# Patient Record
Sex: Male | Born: 1995 | Race: White | Hispanic: No | Marital: Single | Smoking: Never smoker
Health system: Southern US, Community
[De-identification: ages and names within clinical notes are randomized; demographics above are authoritative.]

## PROBLEM LIST (undated history)

## (undated) DIAGNOSIS — N63 Unspecified lump in unspecified breast: Secondary | ICD-10-CM

## (undated) DIAGNOSIS — F419 Anxiety disorder, unspecified: Secondary | ICD-10-CM

## (undated) DIAGNOSIS — J302 Other seasonal allergic rhinitis: Secondary | ICD-10-CM

## (undated) DIAGNOSIS — R300 Dysuria: Secondary | ICD-10-CM

## (undated) DIAGNOSIS — F429 Obsessive-compulsive disorder, unspecified: Secondary | ICD-10-CM

## (undated) DIAGNOSIS — H6093 Unspecified otitis externa, bilateral: Secondary | ICD-10-CM

## (undated) DIAGNOSIS — K219 Gastro-esophageal reflux disease without esophagitis: Secondary | ICD-10-CM

## (undated) DIAGNOSIS — N62 Hypertrophy of breast: Secondary | ICD-10-CM

## (undated) HISTORY — DX: Dysuria: R30.0

## (undated) HISTORY — DX: Unspecified lump in unspecified breast: N63.0

## (undated) HISTORY — DX: Hypertrophy of breast: N62

---
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
## 2017-01-05 ENCOUNTER — Encounter: Payer: Self-pay | Admitting: General Surgery

## 2017-01-05 ENCOUNTER — Ambulatory Visit (INDEPENDENT_AMBULATORY_CARE_PROVIDER_SITE_OTHER): Payer: Medicaid Other | Admitting: General Surgery

## 2017-01-05 VITALS — BP 130/70 | HR 78 | Resp 14 | Ht 61.0 in | Wt 133.0 lb

## 2017-01-05 DIAGNOSIS — N6082 Other benign mammary dysplasias of left breast: Secondary | ICD-10-CM

## 2017-01-05 NOTE — Progress Notes (Signed)
Patient ID: Sean Compton, male   DOB: 1995-10-01, 21 y.o.   MRN: [id number]  Chief Complaint  Patient presents with  . Follow-up    HPI Sean Compton is a 21 y.o. male here today for a follow up left breast nodule. Patient states the area feels hard and has got bigger.  He noticed this about one week ago. He is here with his mother, Sean Compton.  The patient's mother reports because of his OCD he is unable to refrain from manipulating the area of concern as suggested his last visit.  She reported that she wanted an ultrasound completed to determine if this was a cyst.  There is a strong family history of breast cancer, and by the patient's guardians report, his biologic mother had breast cancer at age under 12, although she is unclear whether she had undergone any genetic testing. The best I understand the family dynamics 2 daughters from this woman R BRCA negative but have some mutation that was 7 high risk which results in increased frequency of screening.  No documentation regarding the patient's siblings/first-degree relatives are available.  HPI  Past Medical History:  Diagnosis Date  . Anxiety   . Bilateral external ear infections   . GERD (gastroesophageal reflux disease)   . Gynecomastia 2012   right   . OCD (obsessive compulsive disorder)   . Seasonal allergies     History reviewed. No pertinent surgical history.  Family History  Problem Relation Age of Onset  . Breast cancer Mother 26       states non genetic while in [place].  . Breast cancer Maternal Aunt 22       cystosarcoma   . Thyroid cancer Maternal Uncle 52  . Breast cancer Maternal Grandmother   . Prostate cancer Neg Hx   . Bladder Cancer Neg Hx   . Kidney cancer Neg Hx     Social History Social History   Tobacco Use  . Smoking status: Never Smoker  . Smokeless tobacco: Never Used  Substance Use Topics  . Alcohol use: No  . Drug use: No    Allergies  Allergen Reactions  . Shellfish Allergy Other  (See Comments)    Unknown  . Codeine Rash    Current Outpatient Medications  Medication Sig Dispense Refill  . diphenhydrAMINE (BENADRYL) 25 mg capsule Take 25 mg by mouth at bedtime as needed.    Marland Kitchen omeprazole (PRILOSEC) 20 MG capsule Take 20 mg by mouth daily.     No current facility-administered medications for this visit.     Review of Systems Review of Systems  Constitutional: Negative.   Respiratory: Negative.   Cardiovascular: Negative.     Blood pressure 130/70, pulse 78, resp. rate 14, height _0  (1.549 m), weight 133 lb (60.3 kg).  Physical Exam Physical Exam  Constitutional: He is oriented to person, place, and time. He appears well-developed and well-nourished.  Pulmonary/Chest:    Neurological: He is alert and oriented to person, place, and time.  Skin: Skin is warm and dry.       Assessment    Stable breast exam.      Plan    Options for management were reviewed: 1) excision of the index area with the hopes that the patient's frequent manipulations of sore areas would not result in a wound infection versus 2) observation.  I don't believe that ultrasound examination would demarcate benign or malignant processes for this well-defined 3 mm nodular density in the dermis.  The male guardian reports that "her doctor" in [place] would be easily able to distinguish benign from malignant disease.  The likelihood of malignancy in a male relative with a non-BRCA family is exceptionally small. For a symptomatic lesion (for which the patient picks and massages the area) excision would be more appropriate than a nondiagnostic imaging study.  The patient's male guardian feels otherwise, and has reported that she will takethe patient to another physician for assessment.      HPI, Physical Exam, Assessment and Plan have been scribed under the direction and in the presence of Sean Ard, MD.  Sean Compton, CMA  I have completed the exam and  reviewed the above documentation for accuracy and completeness.  I agree with the above.  Haematologist has been used and any errors in dictation or transcription are unintentional.  Sean Compton, M.D., F.A.C.S.  Sean Compton 01/05/2017, 7:55 PM

## 2017-02-02 ENCOUNTER — Encounter: Payer: Self-pay | Admitting: Surgery

## 2017-02-02 ENCOUNTER — Ambulatory Visit (INDEPENDENT_AMBULATORY_CARE_PROVIDER_SITE_OTHER): Payer: Medicaid Other | Admitting: Surgery

## 2017-02-02 VITALS — BP 131/90 | HR 79 | Temp 98.4°F | Ht 61.0 in | Wt 133.6 lb

## 2017-02-02 DIAGNOSIS — N63 Unspecified lump in unspecified breast: Secondary | ICD-10-CM | POA: Diagnosis not present

## 2017-02-02 DIAGNOSIS — N62 Hypertrophy of breast: Secondary | ICD-10-CM

## 2017-02-02 DIAGNOSIS — N6342 Unspecified lump in left breast, subareolar: Secondary | ICD-10-CM

## 2017-02-02 NOTE — Progress Notes (Signed)
Sean Compton is an 22 y.o. male.   Chief Complaint: left breast mass HPI: 8 months, poss growing, tender and pain Fam Hx for breast cancer, colon cancer  Past Medical History:  Diagnosis Date  . Anxiety   . Bilateral external ear infections   . Breast nodule   . Dysuria   . GERD (gastroesophageal reflux disease)   . Gynecomastia 2012   right   . OCD (obsessive compulsive disorder)   . OCD (obsessive compulsive disorder)   . Seasonal allergies     History reviewed. No pertinent surgical history.  Family History  Problem Relation Age of Onset  . Breast cancer Mother 42       states non genetic while in [place].  . Breast cancer Maternal Aunt 22       cystosarcoma   . Thyroid cancer Maternal Uncle 52  . Breast cancer Maternal Grandmother   . Diabetes Maternal Grandmother   . Diabetes Father   . Colon cancer Maternal Grandfather   . Prostate cancer Neg Hx   . Bladder Cancer Neg Hx   . Kidney cancer Neg Hx    Social History:  reports that  has never smoked. he has never used smokeless tobacco. He reports that he does not drink alcohol or use drugs.  Allergies:  Allergies  Allergen Reactions  . Ondansetron Hcl Other (See Comments)    Headache  . Shellfish Allergy Other (See Comments)    Unknown  . Codeine Rash     (Not in a hospital admission)   Review of Systems:   Review of Systems  Constitutional: Negative for chills and fever.  HENT: Negative.   Eyes: Negative.   Respiratory: Negative.   Cardiovascular: Negative.   Gastrointestinal: Negative.   Genitourinary: Negative.   Musculoskeletal: Negative.   Skin: Negative.   Neurological: Negative.   Endo/Heme/Allergies: Negative.   Psychiatric/Behavioral: Negative.     Physical Exam:  Physical Exam  Constitutional: He is oriented to person, place, and time and well-developed, well-nourished, and in no distress. No distress.  Small stature  HENT:  Head: Normocephalic and atraumatic.  Eyes: Pupils are  equal, round, and reactive to light. Right eye exhibits no discharge. Left eye exhibits no discharge. No scleral icterus.  Strabismus OD  Neck: Normal range of motion. No JVD present.  Cardiovascular: Normal rate, regular rhythm and normal heart sounds.  Pulmonary/Chest: Effort normal and breath sounds normal. No respiratory distress. He has no wheezes. He has no rales.  Abdominal: Soft. He exhibits no distension. There is no tenderness. There is no rebound.  Musculoskeletal: Normal range of motion. He exhibits no edema or tenderness.  Lymphadenopathy:    He has no cervical adenopathy.  Neurological: He is alert and oriented to person, place, and time.  Skin: Skin is warm and dry. No rash noted. He is not diaphoretic. No erythema.  Psychiatric: Mood and affect normal.  Vitals reviewed. Breast exam bilateral: No mass in the right breast in the left breast in the subareolar area is a palpable tender mass measuring approximately 1-1-1/2 cm which is fairly flat and attached to the nipple itself.  Blood pressure 131/90, pulse 79, temperature 98.4 F (36.9 C), temperature source Oral, height 5\' 1"  (1.549 m), weight 133 lb 9.6 oz (60.6 kg).    No results found for this or any previous visit (from the past 48 hour(s)). No results found.   Assessment/Plan Suspect male gynecomastia.  No studies been done.  We will obtain an ultrasound  and a left mammogram for diagnosis and her follow-up after that.  Lattie Hawichard E Nekesha Font, MD, FACS

## 2017-02-02 NOTE — Patient Instructions (Signed)
We have scheduled you for a Breast ultrasound today. This will be done at [facility] at the front of [facility]. You're appointment for is scheduled for January 9th at 11:40. Please arrive 15 minutes early for this appointment. If you wish to reschedule your appointment, you may do so by calling [phone].  We will see you back after your testing has been completed. Please see appointment below.   If you have any questions or concerns prior to this appointment, please call our [phone].  We will follow up with you as listed below.   Breast Ultrasound Breast ultrasound, or breast ultrasonogram, is a procedure that is used to check for breast problems. A breast ultrasound uses harmless sound waves and a computer to create pictures of the inside of your breast. This procedure can help determine whether a lump in your breast is a fluid-filled sac (cyst), a solid tumor, or a growth. It can also help determine whether a tumor is cancerous (malignant) or noncancerous (benign). Sometimes a breast ultrasound is done to locate nodules in the breast that are too small to feel but need to be removed during surgery. A breast ultrasound takes about 30 minutes. Tell a health care provider about:  Any allergies you have.  All medicines you are taking, including vitamins, herbs, eye drops, creams, and over-the-counter medicines.  Any blood disorders you have.  Any surgeries you have had.  Any medical conditions you have. What are the risks? Breast ultrasound is safe and painless. Ultrasound imaging does not use X-rays. There are no known risks for this procedure. What happens before the procedure? You do not need to prepare for a breast ultrasound. You will undress from your waist up, so wear loose, comfortable clothing on the day of the procedure. What happens during the procedure?  You will lie down on an examining table.  A health care provider will apply gel to your  breast area.  You may be asked to hold your arm above your head.  The health care provider will move a handheld probe, which looks like a microphone, back and forth over your breast.  The probe will send signals to a computer that will create images of your breast.  When the procedure is over, the gel will be cleaned from your breast, and you can get dressed. What happens after the procedure?  You can go home right away and do all of your usual activities.  It is up to you to get the results of your procedure. Ask your health care provider, or the department that is doing the procedure, when your results will be ready. Summary  A breast ultrasound is a procedure that is used to check for breast problems.  This procedure uses harmless sound waves and a computer to create pictures of the inside of your breast.  This is a safe procedure. This information is not intended to replace advice given to you by your health care provider. Make sure you discuss any questions you have with your health care provider. Document Released: 02/09/2004 Document Revised: 09/24/2015 Document Reviewed: 09/21/2015 Elsevier Interactive Patient Education  2017 [employer]

## 2017-02-08 ENCOUNTER — Other Ambulatory Visit: Payer: Medicaid Other

## 2017-02-09 ENCOUNTER — Telehealth: Payer: Self-pay | Admitting: Surgery

## 2017-02-09 DIAGNOSIS — K219 Gastro-esophageal reflux disease without esophagitis: Secondary | ICD-10-CM | POA: Insufficient documentation

## 2017-02-09 NOTE — Telephone Encounter (Signed)
Left several messages for the patient to call the office

## 2017-02-10 ENCOUNTER — Ambulatory Visit
Admission: RE | Admit: 2017-02-10 | Discharge: 2017-02-10 | Disposition: A | Discharge status: Home / Self Care | Payer: Medicaid Other | Source: Ambulatory Visit | Attending: Surgery | Admitting: Surgery

## 2017-02-10 ENCOUNTER — Other Ambulatory Visit: Payer: Self-pay

## 2017-02-10 DIAGNOSIS — N6322 Unspecified lump in the left breast, upper inner quadrant: Secondary | ICD-10-CM | POA: Insufficient documentation

## 2017-02-10 DIAGNOSIS — F419 Anxiety disorder, unspecified: Secondary | ICD-10-CM | POA: Insufficient documentation

## 2017-02-10 DIAGNOSIS — N63 Unspecified lump in unspecified breast: Secondary | ICD-10-CM

## 2017-02-10 DIAGNOSIS — N62 Hypertrophy of breast: Secondary | ICD-10-CM | POA: Insufficient documentation

## 2017-02-10 DIAGNOSIS — F849 Pervasive developmental disorder, unspecified: Secondary | ICD-10-CM | POA: Insufficient documentation

## 2017-02-10 DIAGNOSIS — F88 Other disorders of psychological development: Secondary | ICD-10-CM | POA: Insufficient documentation

## 2017-02-14 ENCOUNTER — Ambulatory Visit: Payer: Self-pay | Admitting: Surgery

## 2017-02-16 ENCOUNTER — Encounter: Payer: Self-pay | Admitting: Surgery

## 2017-02-16 ENCOUNTER — Ambulatory Visit (INDEPENDENT_AMBULATORY_CARE_PROVIDER_SITE_OTHER): Payer: Medicaid Other | Admitting: Surgery

## 2017-02-16 VITALS — BP 107/71 | HR 89 | Temp 98.3°F | Ht 61.0 in | Wt 131.0 lb

## 2017-02-16 DIAGNOSIS — N62 Hypertrophy of breast: Secondary | ICD-10-CM | POA: Diagnosis not present

## 2017-02-16 NOTE — Progress Notes (Signed)
Outpatient Surgical Follow Up  02/16/2017  Sean Compton is an 22 y.o. male.   CC: Left breast pain  HPI: This patient with left breast pain it has been ongoing and recurrent.  Workup has suggested gynecomastia.  He has no discharge and no skin changes.  Past Medical History:  Diagnosis Date  . Anxiety   . Bilateral external ear infections   . Breast nodule   . Dysuria   . GERD (gastroesophageal reflux disease)   . Gynecomastia 2012   right   . OCD (obsessive compulsive disorder)   . OCD (obsessive compulsive disorder)   . Seasonal allergies     History reviewed. No pertinent surgical history.  Family History  Problem Relation Age of Onset  . Breast cancer Mother 2336       states non genetic while in [place].  . Breast cancer Maternal Aunt 22       cystosarcoma   . Thyroid cancer Maternal Uncle 52  . Breast cancer Maternal Grandmother   . Diabetes Maternal Grandmother   . Diabetes Father   . Colon cancer Maternal Grandfather   . Prostate cancer Neg Hx   . Bladder Cancer Neg Hx   . Kidney cancer Neg Hx     Social History:  reports that  has never smoked. he has never used smokeless tobacco. He reports that he does not drink alcohol or use drugs.  Allergies:  Allergies  Allergen Reactions  . Ondansetron Hcl Other (See Comments)    Headache  . Shellfish Allergy Other (See Comments)    Unknown  . Codeine Rash    Medications reviewed.   Review of Systems:   Review of Systems  Constitutional: Negative.   HENT: Negative.   Eyes: Negative.   Respiratory: Negative.   Cardiovascular: Negative.   Gastrointestinal: Negative.   Genitourinary: Negative.   Musculoskeletal: Negative.   Skin: Negative.   Neurological: Negative.   Endo/Heme/Allergies: Negative.   Psychiatric/Behavioral: Negative.      Physical Exam:  BP 107/71   Pulse 89   Temp 98.3 F (36.8 C) (Oral)   Ht 5\' 1"  (1.549 m)   Wt 131 lb (59.4 kg)   BMI 24.75 kg/m   Physical Exam   Constitutional: He is well-developed, well-nourished, and in no distress. No distress.  HENT:  Head: Normocephalic and atraumatic.  Eyes: Pupils are equal, round, and reactive to light. Right eye exhibits no discharge. Left eye exhibits no discharge. No scleral icterus.  Strabismus  Pulmonary/Chest: Effort normal. No respiratory distress.  Musculoskeletal: Normal range of motion. He exhibits no edema.  Skin: Skin is warm and dry. No rash noted. He is not diaphoretic. No erythema.  Tattoos  Vitals reviewed.   Breast exam demonstrates no nodule in the right breast on the left there is a subareolar soft nodule which is slightly tender and no change in size.  No results found for this or any previous visit (from the past 48 hour(s)). No results found.  Assessment/Plan:  Breast ultrasound is personally reviewed.  Suspect left-sided gynecomastia without sign of malignancy.  Discussed the pathophysiology of this with the patient and caregiver.  Would recommend reexamination on an as-needed basis.  Lattie Hawichard E Abhiram Criado, MD, FACS

## 2017-02-16 NOTE — Patient Instructions (Signed)
We will not need to follow up with you at this time how ever if you notice that this area is becoming larger and/or more painful please give our office a call.   Gynecomastia, Adult Gynecomastia is an overgrowth of gland tissue in a man's breasts. This may cause one or both breasts to become enlarged. This often develops in men who have an imbalance of the male sex hormone (testosterone) and the male sex hormone (estrogen). This means that a man may have too much estrogen, too little testosterone, or both. Gynecomastia may be a normal part of aging for some men. It can also happen to adolescent boys during puberty. What are the causes? Gynecomastia may be caused by:  Certain medicines, such as: ? Estrogen supplements and medicines that act like estrogen in the body. ? Medicines that keep testosterone from functioning normally in the body (testosterone-inhibiting drugs). ? Anabolic steroids. ? Medicines to treat heartburn, cancer, heart disease, mental health problems, HIV (human immunodeficiency virus) or AIDS (acquired immunodeficiency syndrome). ? Antibiotic medicine. ? Chemotherapy medicine.  Recreational drugs, including alcohol, marijuana, and opioids.  Herbal products, including lavender and tea tree oil.  A gene that is passed along from parent to child (inherited).  Tumors in the pituitary or adrenal gland.  An overactive thyroid gland.  Certain inherited disorders, including a genetic disease that causes low testosterone in males (Klinefelter syndrome).  Cancer of the lung, kidney, liver, testicle, or gastrointestinal tract.  Conditions that cause liver or kidney failure.  Poor nutrition and starvation.  Testicle shrinking or failure (testicularatrophy).  In some cases, the cause may not be known. What increases the risk? You may have a higher risk for gynecomastia if you:  Are 20 years old or older.  Are overweight.  Abuse alcohol or other drugs.  Have a  family history of gynecomastia.  What are the signs or symptoms?  Most of the time, breast enlargement is the only symptom. The enlargement may start near the nipple, and the breast tissue may feel firm and rubbery. The breast may feel itchy, painful or tender. How is this diagnosed? This condition may be diagnosed based on:  Your symptoms.  Your medical history.  A physical exam.  Imaging tests, such as: ? An ultrasound. ? A mammogram. ? An MRI.  Blood tests.  Removal of a sample of breast tissue to be tested in a lab (biopsy).  How is this treated? Gynecomastia may go away on its own, without treatment. If gynecomastia is caused by a medical problem or drug abuse, treatment may include:  Getting treatment for the underlying medical problem or for drug abuse.  Changing or stopping medicines.  Medicines to block the effects of estrogen.  Taking a testosterone replacement.  Surgery to remove breast tissue or any lumps in your breasts.  Breast reduction surgery. This may be a possibility if you have severe or painful gynecomastia.  Follow these instructions at home:  Take over-the-counter and prescription medicines only as told by your health care provider.  Talk to your health care provider before taking any herbal medicines or diet supplements.  Do not abuse drugs or alcohol.  Keep all follow-up visits as told by your health care provider. This is important. Contact a health care provider if:  Your breast tissue grows larger or gets more swollen or painful.  You have a lump in your testicle.  You have blood or discharge coming from your nipples.  Your nipple changes shape.  You develop  a hard or painful lump in your breast. This information is not intended to replace advice given to you by your health care provider. Make sure you discuss any questions you have with your health care provider. Document Released: 03/13/2015 Document Revised: 06/26/2015 Document  Reviewed: 03/13/2015 Elsevier Interactive Patient Education  [employer].

## 2017-06-07 IMAGING — CR DG CHEST 2V
2 series · 2 of 2 positions shown · non-contrast
Comparison: Chest radiograph performed 06/05/2016

CLINICAL DATA: Acute onset of cough, sore throat and fever. Initial
encounter.

EXAM:
CHEST  2 VIEW

[chest lat]
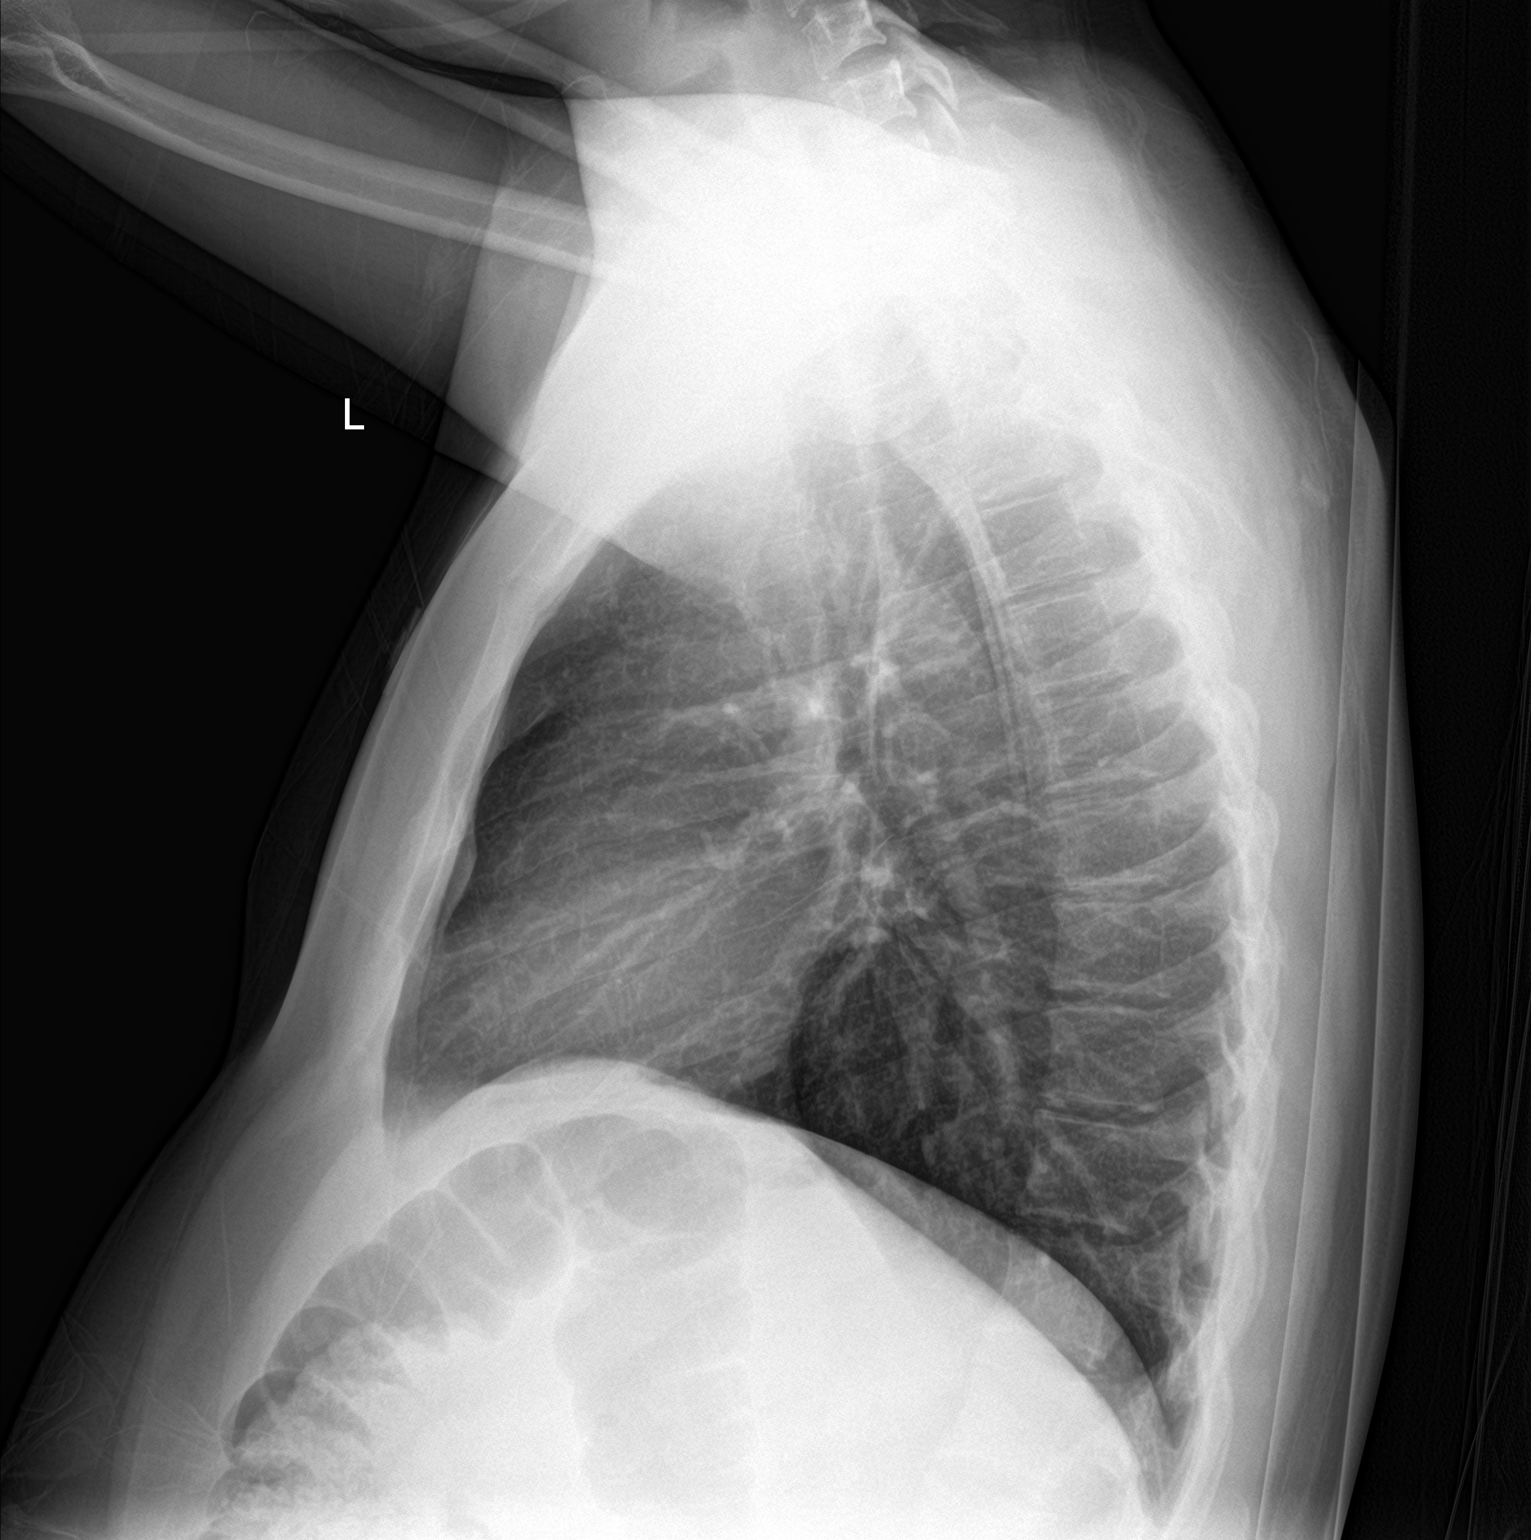

[chest ap]
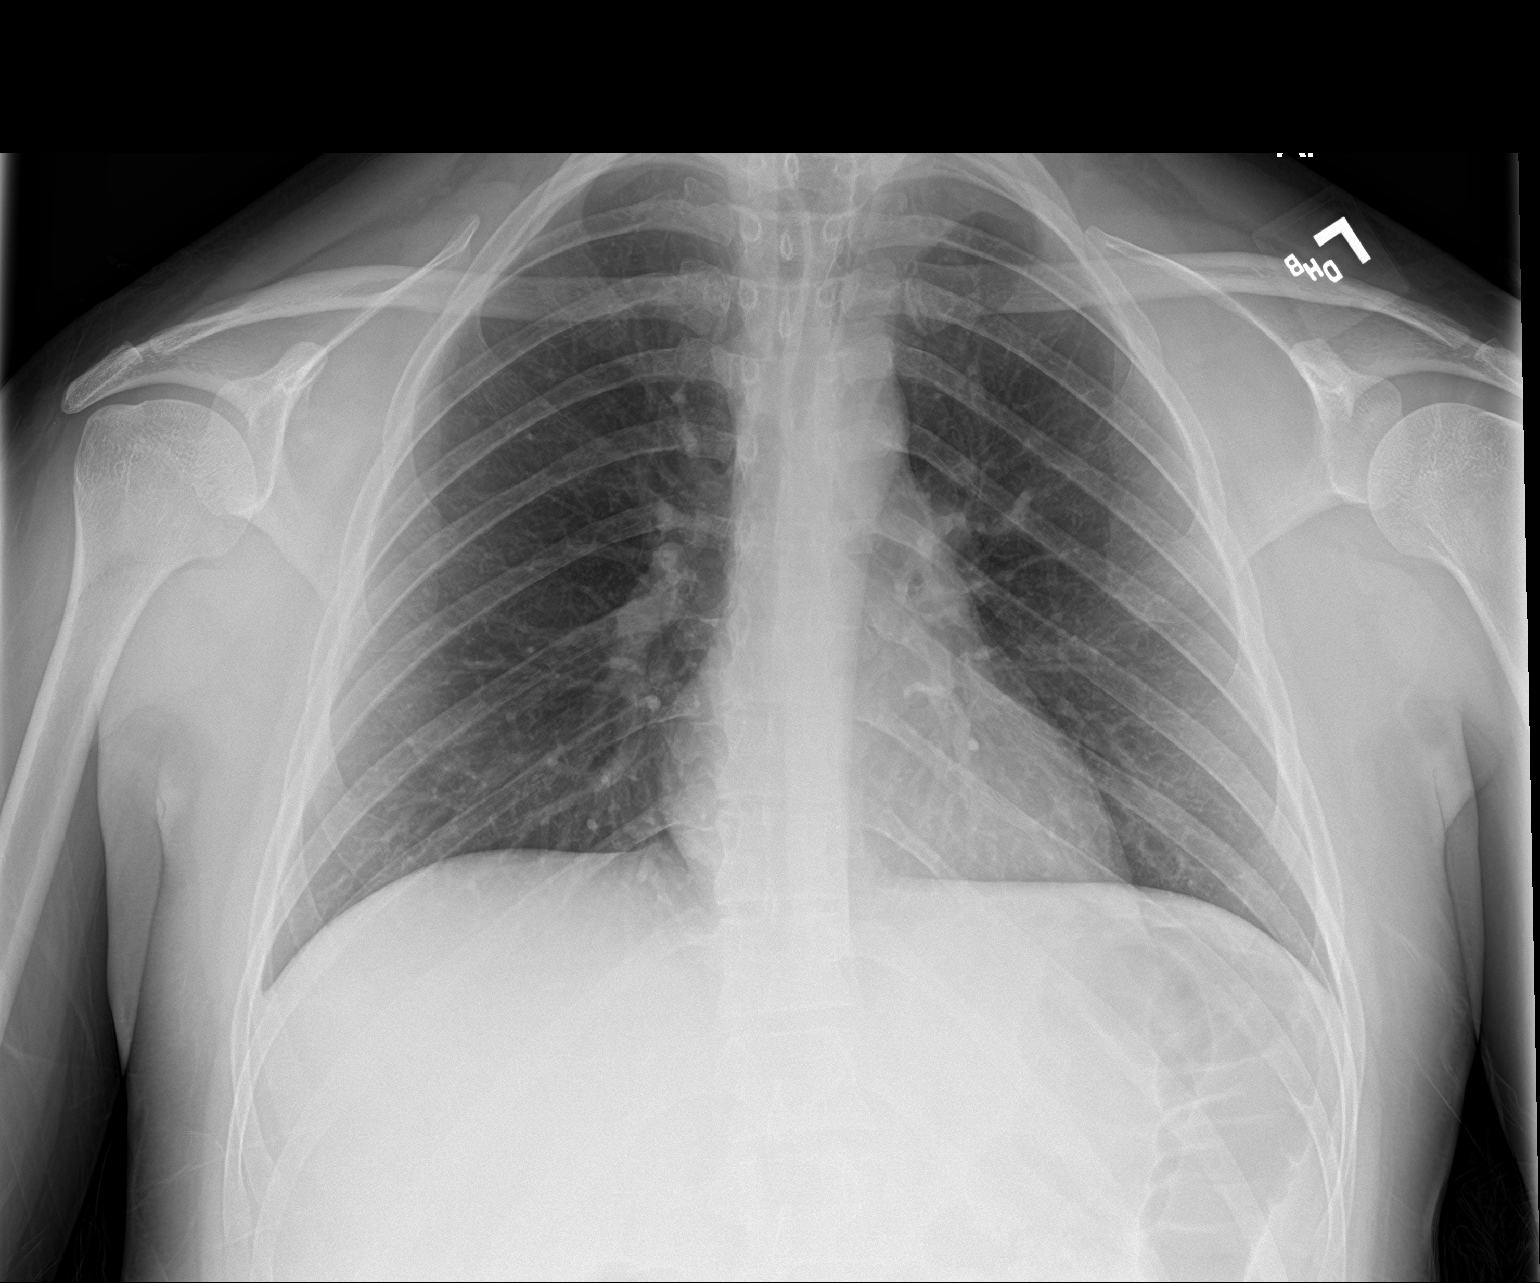

[2 of 2 positions shown; findings below may reference images not displayed]

FINDINGS: The lungs are well-aerated and clear. There is no evidence of focal
opacification, pleural effusion or pneumothorax.

The heart is normal in size; the mediastinal contour is within
normal limits. No acute osseous abnormalities are seen.
IMPRESSION: No acute cardiopulmonary process seen.

## 2017-06-08 IMAGING — DX DG CHEST 1V
1 series · 1 of 1 positions shown · non-contrast
Comparison: Chest radiograph dated 06/27/2016

CLINICAL DATA: 20-year-old male with tachycardia.

EXAM:
CHEST 1 VIEW

[chest ap]
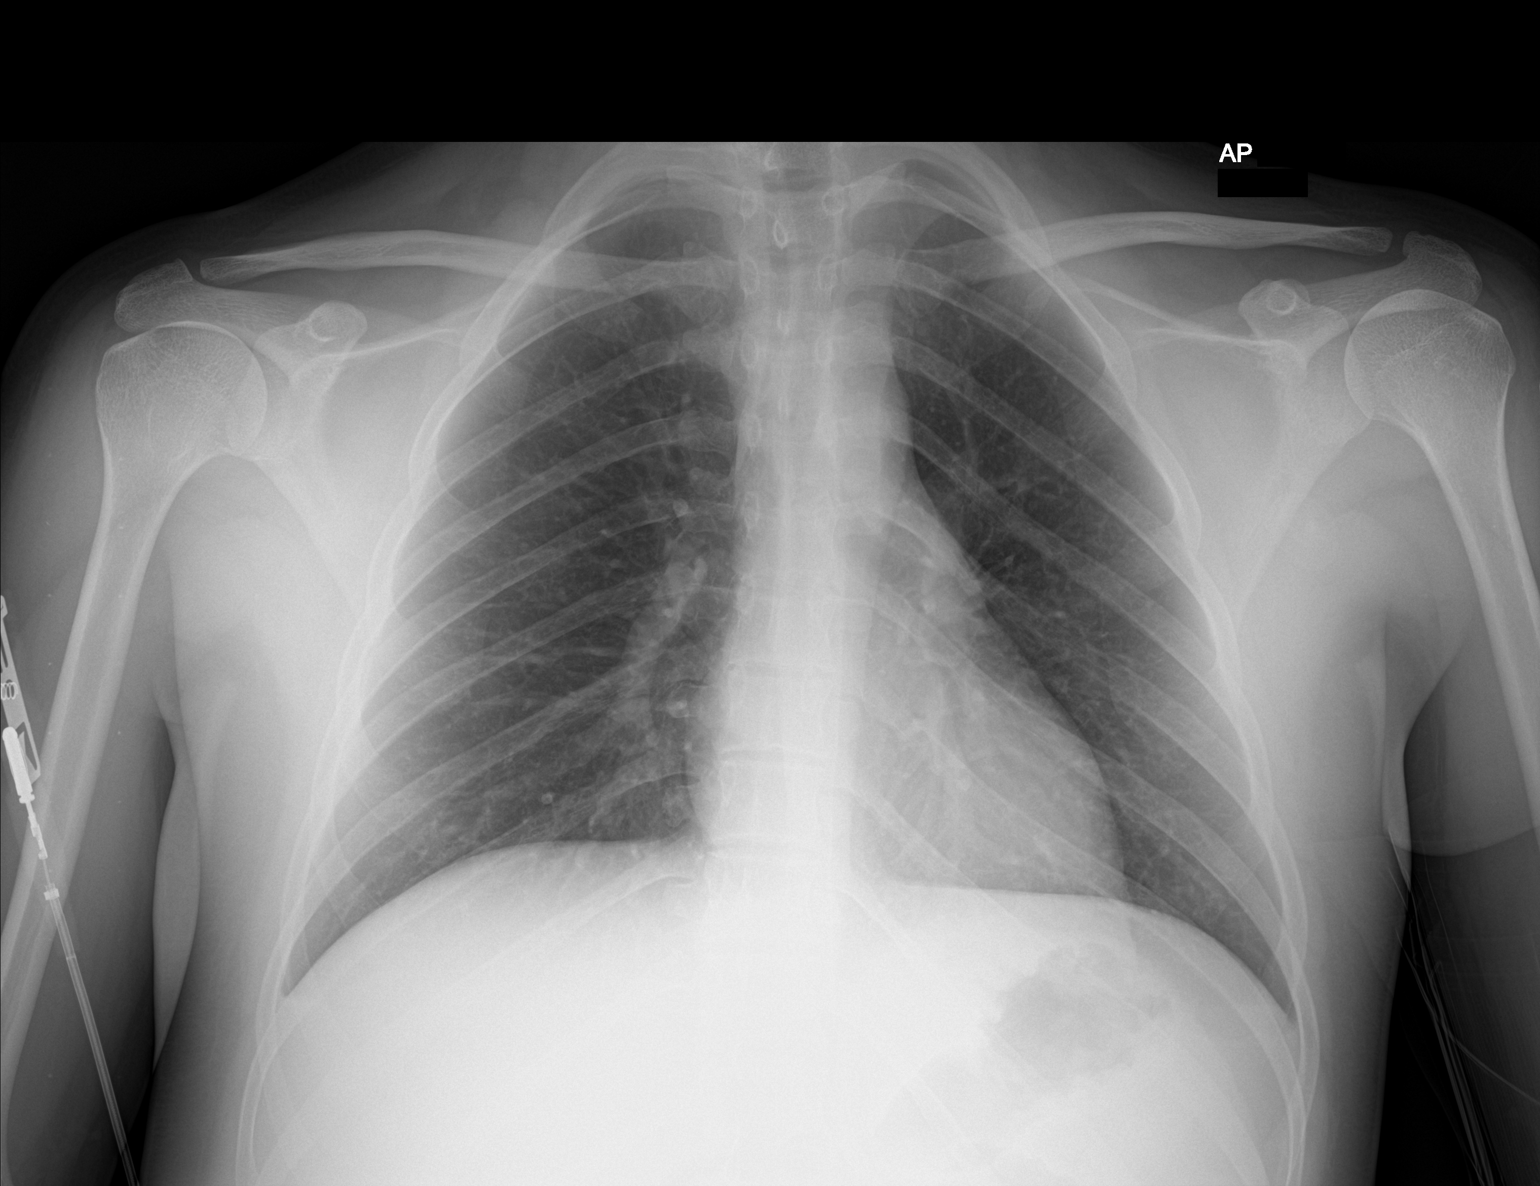

[1 of 1 positions shown; findings below may reference images not displayed]

FINDINGS: The heart size and mediastinal contours are within normal limits.
Both lungs are clear. The visualized skeletal structures are
unremarkable.
IMPRESSION: No active disease.

## 2018-01-21 IMAGING — US US BREAST*L* LIMITED INC AXILLA
1 series · 2 of 2 positions shown · non-contrast
Comparison: Baseline evaluation

CLINICAL DATA: Palpable abnormality in the 10 o'clock retroareolar
region of the left breast.

EXAM:
ULTRASOUND OF THE LEFT BREAST

[Series 1: us breast*left* limited inc axilla · 0.04mm/px · 2 of 2 slices shown]
[im 1/2]
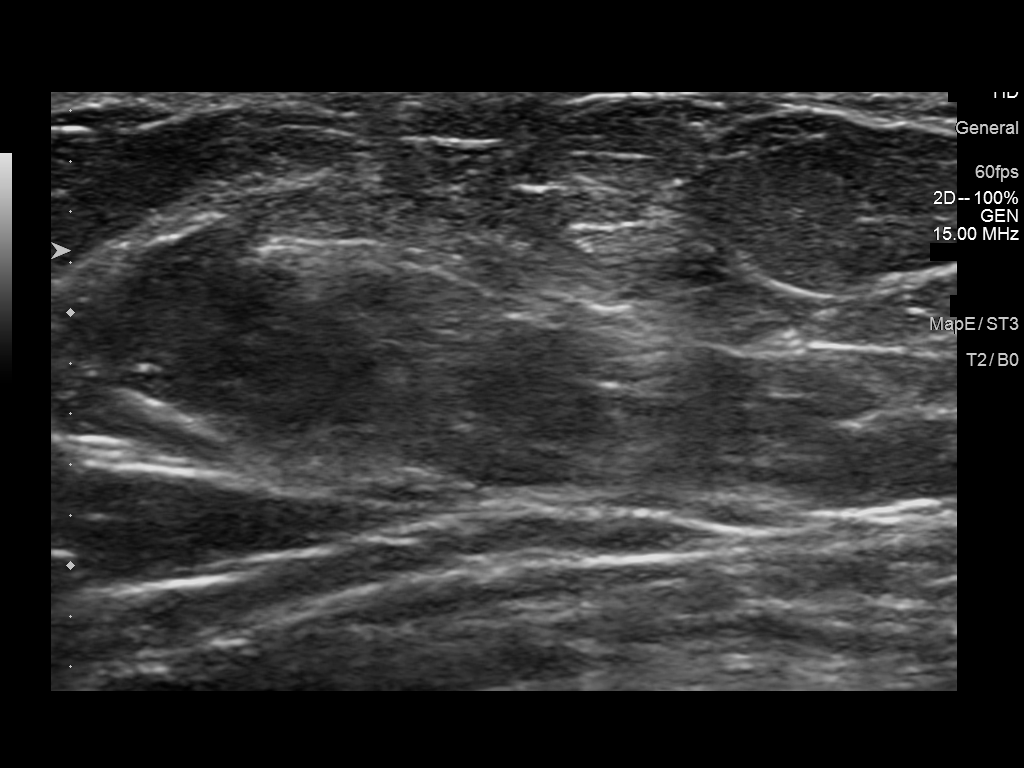
[im 2/2]
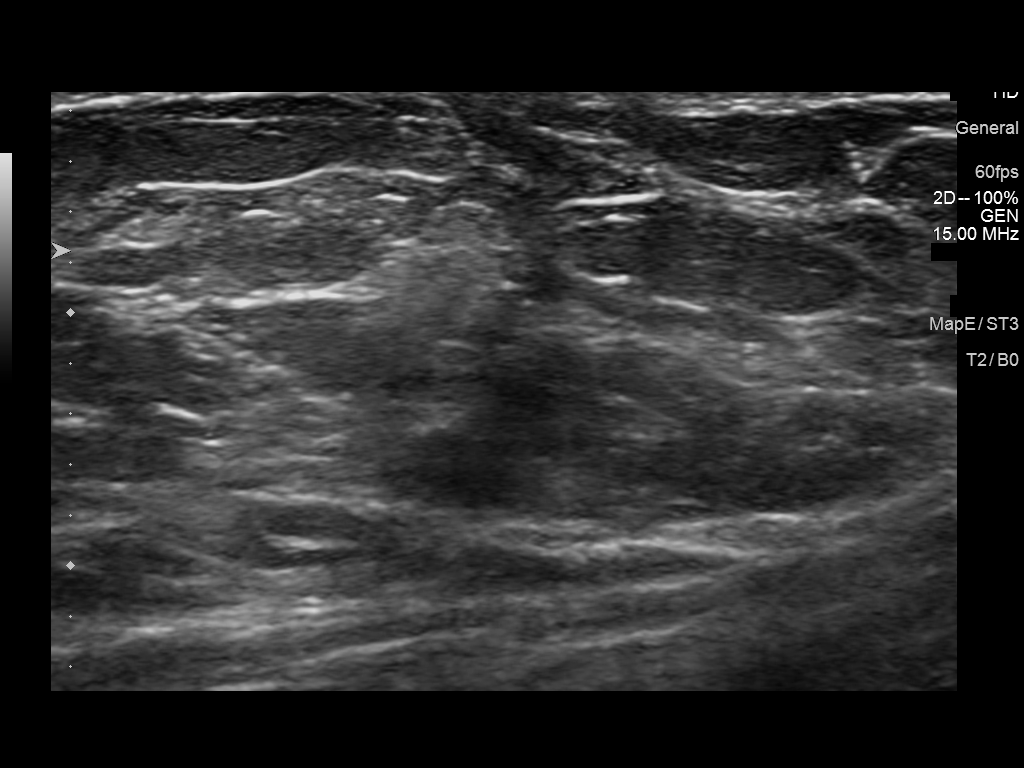

[2 of 2 positions shown; findings below may reference images not displayed]

FINDINGS: On physical exam, I palpate focal soft thickening in the 10 o'clock
retroareolar region of the left breast. I palpate no discrete mass
in this region or elsewhere in the retroareolar region.

Targeted ultrasound is performed, showing normal appearing
fibroglandular tissue in the area of concern to the. There is
minimal retroareolar tissue. No significant gynecomastia.
IMPRESSION: No ultrasound evidence for malignancy.

RECOMMENDATION:
Clinical follow-up as needed.

I have discussed the findings and recommendations with the patient
and his mother. Results were also provided in writing at the
conclusion of the visit. If applicable, a reminder letter will be
sent to the patient regarding the next appointment.

BI-RADS CATEGORY  1: Negative.

## 2021-04-23 ENCOUNTER — Ambulatory Visit: Payer: Medicaid Other | Admitting: Family Medicine

## 2021-04-28 ENCOUNTER — Ambulatory Visit: Payer: Medicaid Other | Admitting: Family Medicine
